# Patient Record
Sex: Female | Born: 1985 | Race: Black or African American | Hispanic: No | Marital: Single | Smoking: Never smoker
Health system: Southern US, Community
[De-identification: ages and names within clinical notes are randomized; demographics above are authoritative.]

## PROBLEM LIST (undated history)

## (undated) DIAGNOSIS — I10 Essential (primary) hypertension: Secondary | ICD-10-CM

## (undated) DIAGNOSIS — G4700 Insomnia, unspecified: Secondary | ICD-10-CM

## (undated) DIAGNOSIS — G43909 Migraine, unspecified, not intractable, without status migrainosus: Secondary | ICD-10-CM

## (undated) HISTORY — DX: Essential (primary) hypertension: I10

## (undated) HISTORY — PX: NO PAST SURGERIES: SHX2092

## (undated) HISTORY — DX: Insomnia, unspecified: G47.00

---
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
## 2007-09-08 ENCOUNTER — Emergency Department (HOSPITAL_COMMUNITY): Admission: EM | Admit: 2007-09-08 | Discharge: 2007-09-08 | Payer: Self-pay | Admitting: Family Medicine

## 2007-10-27 ENCOUNTER — Emergency Department (HOSPITAL_COMMUNITY): Admission: EM | Admit: 2007-10-27 | Discharge: 2007-10-27 | Payer: Self-pay | Admitting: Family Medicine

## 2008-11-13 ENCOUNTER — Emergency Department (HOSPITAL_COMMUNITY): Admission: EM | Admit: 2008-11-13 | Discharge: 2008-11-13 | Payer: Self-pay | Admitting: Emergency Medicine

## 2009-04-04 ENCOUNTER — Emergency Department (HOSPITAL_COMMUNITY): Admission: EM | Admit: 2009-04-04 | Discharge: 2009-04-04 | Payer: Self-pay | Admitting: Family Medicine

## 2009-06-30 ENCOUNTER — Emergency Department (HOSPITAL_COMMUNITY): Admission: EM | Admit: 2009-06-30 | Discharge: 2009-06-30 | Payer: Self-pay | Admitting: Family Medicine
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
## 2011-02-01 LAB — POCT URINALYSIS DIP (DEVICE)
Bilirubin Urine: NEGATIVE
Glucose, UA: NEGATIVE
Ketones, ur: NEGATIVE
Nitrite: POSITIVE — AB
Protein, ur: 100 — AB
Specific Gravity, Urine: 1.02

## 2011-02-01 LAB — POCT PREGNANCY, URINE
Operator id: 239701
Preg Test, Ur: NEGATIVE

## 2011-02-01 LAB — URINE CULTURE

## 2012-11-13 ENCOUNTER — Emergency Department (HOSPITAL_COMMUNITY)
Admission: EM | Admit: 2012-11-13 | Discharge: 2012-11-13 | Disposition: A | Discharge status: Home / Self Care | Payer: BC Managed Care – PPO | Source: Home / Self Care | Attending: Family Medicine | Admitting: Family Medicine

## 2012-11-13 ENCOUNTER — Encounter (HOSPITAL_COMMUNITY): Payer: Self-pay | Admitting: Emergency Medicine

## 2012-11-13 DIAGNOSIS — B9789 Other viral agents as the cause of diseases classified elsewhere: Secondary | ICD-10-CM

## 2012-11-13 DIAGNOSIS — J329 Chronic sinusitis, unspecified: Secondary | ICD-10-CM

## 2012-11-13 DIAGNOSIS — G43909 Migraine, unspecified, not intractable, without status migrainosus: Secondary | ICD-10-CM

## 2012-11-13 HISTORY — DX: Migraine, unspecified, not intractable, without status migrainosus: G43.909

## 2012-11-13 MED ORDER — FLUTICASONE PROPIONATE 50 MCG/ACT NA SUSP: 2.0000 | Freq: Every day | NASAL | Status: DC

## 2012-11-13 MED ORDER — DEXAMETHASONE SODIUM PHOSPHATE 10 MG/ML IJ SOLN
10.0000 mg | Freq: Once | INTRAMUSCULAR | Status: AC
  Administered 2012-11-13: 10 mg via INTRAVENOUS

## 2012-11-13 MED ORDER — DEXAMETHASONE SODIUM PHOSPHATE 10 MG/ML IJ SOLN
INTRAMUSCULAR | Status: AC
  Filled 2012-11-13: qty 1

## 2012-11-13 MED ORDER — HYDROCODONE-ACETAMINOPHEN 5-325 MG PO TABS: 1.0000 | ORAL_TABLET | Freq: Every evening | ORAL | Status: DC | PRN

## 2012-11-13 MED ORDER — KETOROLAC TROMETHAMINE 60 MG/2ML IM SOLN
INTRAMUSCULAR | Status: AC
  Filled 2012-11-13: qty 2

## 2012-11-13 MED ORDER — KETOROLAC TROMETHAMINE 60 MG/2ML IM SOLN
60.0000 mg | Freq: Once | INTRAMUSCULAR | Status: AC
  Administered 2012-11-13: 60 mg via INTRAMUSCULAR

## 2012-11-13 NOTE — ED Provider Notes (Signed)
   History    CSN: [id number] Arrival date & time 11/13/12  1719  First MD Initiated Contact with Patient 11/13/12 1759     Chief Complaint  Patient presents with  . Facial Pain    sinus pressure and pain. runny nose. headache (hx of migraines) bilateral ear pain. generlized body aches.     HPI Facial pain: started 2 weeks ago. Associated w/ runny nose, bilateral ear pain. Generalized body aches and fatigue, and HA. Pt typically takes Vicodin and percocet for migraines QHS daily, but PCP no longer will take her insurance so she is out of these medications. Takes these daily at night. Has not taken for 2 days. Denies sick contacts, fever, nausea, vomiting, rash, diarrhea, constipation. Tylenol sinus w/ some relief of sinus pressure.   Past Medical History  Diagnosis Date  . Migraines    History reviewed. No pertinent past surgical history. History reviewed. No pertinent family history. History  Substance Use Topics  . Smoking status: Never Smoker   . Smokeless tobacco: Not on file  . Alcohol Use: No   OB History   Grav Para Term Preterm Abortions TAB SAB Ect Mult Living                 Review of Systems Per HPI w/ all other systems negative Allergies  Review of patient's allergies indicates no known allergies.  Home Medications  No current outpatient prescriptions on file. BP 137/94  Pulse 81  Temp(Src) 98.4 F (36.9 C) (Oral)  Resp 24  SpO2 100%  LMP 11/10/2012 Physical Exam Gen: NAD, WNWD HEENT: mild maxillary and frontal sinus pressure on palpation, nasal turbinates boggy, oropharynx clear, no cervical lymphadenopathy, TM nml bilat. CV: RRR, no m/r/g Res: CTAB, nml effort Ext: no edema, warm well perfused Skin: intact, no rash Nero, CN 2-12 intact, proprioception intact, cerebellar function nml    ED Course  Procedures (including critical care time) Labs Reviewed - No data to display No results found. No diagnosis found.  MDM  27yo AAF w/ migraine and  viral URI.  - toradol  - decadron (as migraine for <48 hrs may benefit)  - Will discharge w/ Rx for 7 days of vicodin 5/325. Pt counseled to find new PCP as this is not typical mgt for migraines. However given long term use there is concern for withdrawal and that migraines are now in part due to prolonged opioid use.  - Flonase for sinus congestion    Ozella Rocks, MD 11/13/12 [phone]

## 2012-11-13 NOTE — ED Notes (Signed)
Pt c/o sinus pressure and pain. Runny nose. Bilateral ear pain and headache. (hx of migraines) Pt denies fever, n/v/d. Symptoms present x 1 wk. Pt has used otc meds with no relief in symptoms.

## 2012-11-14 NOTE — ED Provider Notes (Signed)
Medical screening examination/treatment/procedure(s) were performed by resident physician or non-physician practitioner and as supervising physician I was immediately available for consultation/collaboration.   Lyndsay Talamante DOUGLAS MD.   Edgar Corrigan D Aliya Sol, MD 11/14/12 1640 

## 2013-02-19 ENCOUNTER — Encounter (HOSPITAL_COMMUNITY): Payer: Self-pay | Admitting: Emergency Medicine

## 2013-02-19 ENCOUNTER — Emergency Department (HOSPITAL_COMMUNITY)
Admission: EM | Admit: 2013-02-19 | Discharge: 2013-02-19 | Disposition: A | Discharge status: Home / Self Care | Payer: BC Managed Care – PPO | Attending: Emergency Medicine | Admitting: Emergency Medicine

## 2013-02-19 DIAGNOSIS — J329 Chronic sinusitis, unspecified: Secondary | ICD-10-CM | POA: Insufficient documentation

## 2013-02-19 DIAGNOSIS — Z792 Long term (current) use of antibiotics: Secondary | ICD-10-CM | POA: Insufficient documentation

## 2013-02-19 DIAGNOSIS — Z8679 Personal history of other diseases of the circulatory system: Secondary | ICD-10-CM | POA: Insufficient documentation

## 2013-02-19 MED ORDER — HYDROCODONE-ACETAMINOPHEN 5-325 MG PO TABS: 1.0000 | ORAL_TABLET | Freq: Four times a day (QID) | ORAL | Status: DC | PRN

## 2013-02-19 MED ORDER — AMOXICILLIN 500 MG PO CAPS: 500.0000 mg | ORAL_CAPSULE | Freq: Three times a day (TID) | ORAL | Status: DC

## 2013-02-19 MED ORDER — FLUCONAZOLE 200 MG PO TABS: ORAL_TABLET | ORAL | Status: DC

## 2013-02-19 NOTE — ED Notes (Signed)
Pt states headache, cough, sinus congestion x 2 weeks.  No fever.  Some days sore throat.  No diarrhea.

## 2013-02-19 NOTE — ED Provider Notes (Signed)
CSN: [id number]     Arrival date & time 02/19/13  2110 History   First MD Initiated Contact with Patient 02/19/13 2139     Chief Complaint  Patient presents with  . Headache  . Nasal Congestion   (Consider location/radiation/quality/duration/timing/severity/associated sxs/prior Treatment) Patient is a 27 y.o. female presenting with cough. The history is provided by the patient (the pt complains of a cough and sinus congestion). No language interpreter was used.  Cough Cough characteristics:  Non-productive Severity:  Moderate Onset quality:  Gradual Timing:  Constant Progression:  Waxing and waning Chronicity:  New Smoker: no   Context: not animal exposure   Associated symptoms: no chest pain, no eye discharge, no headaches and no rash     Past Medical History  Diagnosis Date  . Migraines    History reviewed. No pertinent past surgical history. History reviewed. No pertinent family history. History  Substance Use Topics  . Smoking status: Never Smoker   . Smokeless tobacco: Not on file  . Alcohol Use: No   OB History   Grav Para Term Preterm Abortions TAB SAB Ect Mult Living                 Review of Systems  Constitutional: Negative for appetite change and fatigue.  HENT: Positive for sinus pressure. Negative for congestion and ear discharge.   Eyes: Negative for discharge.  Respiratory: Positive for cough.   Cardiovascular: Negative for chest pain.  Gastrointestinal: Negative for abdominal pain and diarrhea.  Genitourinary: Negative for frequency and hematuria.  Musculoskeletal: Negative for back pain.  Skin: Negative for rash.  Neurological: Negative for seizures and headaches.  Psychiatric/Behavioral: Negative for hallucinations.    Allergies  Review of patient's allergies indicates no known allergies.  Home Medications   Current Outpatient Rx  Name  Route  Sig  Dispense  Refill  . HYDROcodone-acetaminophen (NORCO/VICODIN) 5-325 MG per tablet   Oral   Take 1 tablet by mouth at bedtime as needed for pain.   14 tablet   0   . amoxicillin (AMOXIL) 500 MG capsule   Oral   Take 1 capsule (500 mg total) by mouth 3 (three) times daily.   21 capsule   0   . fluconazole (DIFLUCAN) 200 MG tablet      Take for yeast infection   1 tablet   0    BP 145/103  Pulse 77  Temp(Src) 98.4 F (36.9 C) (Oral)  Resp 18  SpO2 100%  LMP 02/15/2013 Physical Exam  Constitutional: She is oriented to person, place, and time. She appears well-developed.  HENT:  Head: Normocephalic.  Tender frontal sinuses  Eyes: Conjunctivae and EOM are normal. No scleral icterus.  Neck: Neck supple. No thyromegaly present.  Cardiovascular: Normal rate and regular rhythm.  Exam reveals no gallop and no friction rub.   No murmur heard. Pulmonary/Chest: No stridor. She has no wheezes. She has no rales. She exhibits no tenderness.  Abdominal: She exhibits no distension. There is no tenderness. There is no rebound.  Musculoskeletal: Normal range of motion. She exhibits no edema.  Lymphadenopathy:    She has no cervical adenopathy.  Neurological: She is oriented to person, place, and time. She exhibits normal muscle tone. Coordination normal.  Skin: No rash noted. No erythema.  Psychiatric: She has a normal mood and affect. Her behavior is normal.    ED Course  Procedures (including critical care time) Labs Review Labs Reviewed - No data to display Imaging Review  No results found.  EKG Interpretation   None       MDM   1. Sinusitis       Benny Lennert, MD 02/19/13 2158

## 2013-07-03 ENCOUNTER — Institutional Professional Consult (permissible substitution): Payer: BC Managed Care – PPO | Admitting: Pulmonary Disease

## 2013-07-24 ENCOUNTER — Institutional Professional Consult (permissible substitution): Payer: BC Managed Care – PPO | Admitting: Pulmonary Disease

## 2013-08-20 ENCOUNTER — Ambulatory Visit (INDEPENDENT_AMBULATORY_CARE_PROVIDER_SITE_OTHER): Payer: BC Managed Care – PPO | Admitting: Pulmonary Disease

## 2013-08-20 ENCOUNTER — Encounter: Payer: Self-pay | Admitting: Pulmonary Disease

## 2013-08-20 VITALS — BP 120/86 | HR 70 | Temp 98.5°F | Ht 66.0 in | Wt 228.2 lb

## 2013-08-20 DIAGNOSIS — G4700 Insomnia, unspecified: Secondary | ICD-10-CM | POA: Insufficient documentation

## 2013-08-20 NOTE — Progress Notes (Signed)
Subjective:    Patient ID: Alexis CorningShamsa S Meanor, female    DOB: 10/29/1985, 28 y.o.   MRN: [id number]  HPI The patient is a 28 year old female who I've been asked to see for difficulties initiating and maintaining sleep. This has been an ongoing problem for her for many years, but has been worse over the last one to 2 years. She relates difficulty getting to sleep, and will awaken 10-15 times a night for unknown reasons. She often has difficulties getting back to sleep when she awakens. She is not rested in the mornings upon arising. She has been told that she snores if she is tired, but no one has mentioned witnessed apneas during the night. She does not think that she kicks, but does have some discomfort in the evenings in her lower extremities that are made better with movement. She describes some sleep pressure with inactivity during the day, but it is not significant. She does not nap during the day. She does not fall asleep easily in the evenings, and has no sleepiness with driving. She is unsure if her headaches contribute to her sleep issues. The patient states that her weight is up 40 pounds over the last 2 years, and her Epworth score today is 7.   Sleep Questionnaire What time do you typically go to bed?( Between what hours) varies when not in school-- 10pm (when in school) varies when not in school-- 10pm (when in school) at 1128 on 08/20/13 by Maisie FusAshtyn M Green, CMA How long does it take you to fall asleep? several hours several hours at 1128 on 08/20/13 by Maisie FusAshtyn M Green, CMA How many times during the night do you wake up? No Value 10-15x at 1128 on 08/20/13 by Maisie FusAshtyn M Green, CMA What time do you get out of bed to start your day? 0500 0500 at 1128 on 08/20/13 by Maisie FusAshtyn M Green, CMA Do you drive or operate heavy machinery in your occupation? No No at 1128 on 08/20/13 by Maisie FusAshtyn M Green, CMA How much has your weight changed (up or down) over the past two years? (In pounds) 40 lb (18.144  kg) 40 lb (18.144 kg) at 1128 on 08/20/13 by Maisie FusAshtyn M Green, CMA Have you ever had a sleep study before? No No at 1128 on 08/20/13 by Maisie FusAshtyn M Green, CMA Do you currently use CPAP? No No at 1128 on 08/20/13 by Maisie FusAshtyn M Green, CMA Do you wear oxygen at any time? No No at 1128 on 08/20/13 by Maisie FusAshtyn M Green, CMA O2 Flow Rate (L/min)    Review of Systems  Constitutional: Positive for unexpected weight change. Negative for fever.  HENT: Positive for congestion, dental problem, ear pain and sneezing. Negative for nosebleeds, postnasal drip, rhinorrhea, sinus pressure, sore throat and trouble swallowing.        Nose itching/dry nose  Eyes: Positive for itching. Negative for redness.  Respiratory: Negative for cough, chest tightness, shortness of breath and wheezing.   Cardiovascular: Negative for palpitations and leg swelling.  Gastrointestinal: Negative for nausea and vomiting.  Genitourinary: Negative for dysuria.  Musculoskeletal: Negative for joint swelling.  Skin: Negative for rash ( itching).  Neurological: Positive for headaches.  Hematological: Does not bruise/bleed easily.  Psychiatric/Behavioral: Positive for dysphoric mood. The patient is nervous/anxious.        Objective:   Physical Exam Constitutional:  Obese female, no acute distress  HENT:  Nares patent without discharge, piercing thru septum  Oropharynx without exudate, palate and uvula are mildly  elongated.   Eyes:  Perrla, eomi, no scleral icterus  Neck:  No JVD, no TMG  Cardiovascular:  Normal rate, regular rhythm, no rubs or gallops.  No murmurs        Intact distal pulses  Pulmonary :  Normal breath sounds, no stridor or respiratory distress   No rales, rhonchi, or wheezing  Abdominal:  Soft, nondistended, bowel sounds present.  No tenderness noted.   Musculoskeletal:  No lower extremity edema noted.  Lymph Nodes:  No cervical lymphadenopathy noted  Skin:  No cyanosis noted  Neurologic: Mildly  sleepy but appropriate, moves all 4 extremities without obvious deficit.         Assessment & Plan:

## 2013-08-20 NOTE — Patient Instructions (Signed)
Work on the behavioral therapies we discussed. Will send a note to your referring physician for them to consider behavioral therapy. Will schedule for a sleep study to evaluate for sleep apnea or other sleep disorders.   Please leave a number where you can be reached so we can schedule for you.

## 2013-08-20 NOTE — Assessment & Plan Note (Signed)
The patient has had issues initiating and maintaining sleep for many many years, but it has been worse over the last one to 2 years. Some of this is related to very poor sleep hygiene, and I have reviewed the various techniques with her to improve this. She will obviously need cognitive behavioral therapy in order to improve her sleep, and I will send a note back to her referring physician to consider a referral to a behavioral therapist. The question that I have is whether she may have a sleep disorder that is contributing to this, such as sleep apnea given her 40 pound weight gain over the last 2 years. She also has some symptoms suggestive of a movement disorder of sleep. Finally, it is unclear how much her headaches may be contributing to her sleep disruption. I will schedule her for a sleep study for further evaluation, but if it is unremarkable, and I think behavioral therapy will be her best treatment.

## 2013-10-04 ENCOUNTER — Ambulatory Visit (HOSPITAL_BASED_OUTPATIENT_CLINIC_OR_DEPARTMENT_OTHER): Payer: Self-pay | Attending: Pulmonary Disease

## 2015-05-08 ENCOUNTER — Encounter (HOSPITAL_COMMUNITY): Payer: Self-pay | Admitting: Family Medicine

## 2015-05-08 ENCOUNTER — Emergency Department (HOSPITAL_COMMUNITY)
Admission: EM | Admit: 2015-05-08 | Discharge: 2015-05-09 | Disposition: A | Discharge status: Home / Self Care | Payer: Self-pay | Attending: Emergency Medicine | Admitting: Emergency Medicine

## 2015-05-08 DIAGNOSIS — Z79899 Other long term (current) drug therapy: Secondary | ICD-10-CM | POA: Insufficient documentation

## 2015-05-08 DIAGNOSIS — I10 Essential (primary) hypertension: Secondary | ICD-10-CM | POA: Insufficient documentation

## 2015-05-08 DIAGNOSIS — N644 Mastodynia: Secondary | ICD-10-CM | POA: Insufficient documentation

## 2015-05-08 DIAGNOSIS — Z791 Long term (current) use of non-steroidal anti-inflammatories (NSAID): Secondary | ICD-10-CM | POA: Insufficient documentation

## 2015-05-08 DIAGNOSIS — G43909 Migraine, unspecified, not intractable, without status migrainosus: Secondary | ICD-10-CM | POA: Insufficient documentation

## 2015-05-08 NOTE — ED Notes (Signed)
Patient reports she noticed a lump on right breast about 4 weeks ago. Tonight, she noticed the lump got bigger and started having pain.

## 2015-05-08 NOTE — ED Notes (Signed)
Bed: WA26 Expected date:  Expected time:  Means of arrival:  Comments: 

## 2015-05-08 NOTE — ED Provider Notes (Signed)
CSN: [id number]     Arrival date & time 05/08/15  2329 History  By signing my name below, I, Phillis Haggis, attest that this documentation has been prepared under the direction and in the presence of [employer], PA-C. Electronically Signed: Phillis Haggis, ED Scribe. 05/08/2015. 12:09 AM.  Chief Complaint  Patient presents with  . Breast Mass   The history is provided by the patient. No language interpreter was used.   HPI Comments: Alexis Ramsey is a 30 y.o. Female with a hx of HTN who presents to the Emergency Department complaining of a painful lump to the right breast. She states she first noticed the lump about 1 month ago, but it became larger and more painful a few hours ago. She has taken Tylenol for pain to no relief. She states that she is not breast feeding. Pt states she recently began drinking more caffeine. She denies fever.  Pt does not have an OB-GYN.   Past Medical History  Diagnosis Date  . Migraines   . HTN (hypertension)   . Insomnia    Past Surgical History  Procedure Laterality Date  . No past surgeries     Family History  Problem Relation Age of Onset  . Diabetes Mellitus I Mother   . Headache Mother   . Hypertension Mother   . Diabetes Mellitus I Maternal Grandmother   . Hypertension Father   . Hypertension Other     grandparents on both sides   Social History  Substance Use Topics  . Smoking status: Never Smoker   . Smokeless tobacco: None  . Alcohol Use: No   OB History    No data available      Review of Systems  Constitutional: Negative for fever.  Skin:       Mass to right breast  All other systems reviewed and are negative.   Allergies  Review of patient's allergies indicates no known allergies.  Home Medications   Prior to Admission medications   Medication Sig Start Date End Date Taking? Authorizing Provider  ACZONE 5 % topical gel Use as directed for Acne caused by pred injections given for HA 06/22/13   Historical Provider, MD   fluconazole (DIFLUCAN) 200 MG tablet Take for yeast infection 02/19/13   Bethann Berkshire, MD  hydrochlorothiazide (HYDRODIURIL) 12.5 MG tablet Take 1 tablet by mouth daily. 07/30/13   Historical Provider, MD  meloxicam (MOBIC) 7.5 MG tablet Take 2 tablets (15 mg total) by mouth daily. 05/09/15   Antony Madura, PA-C  oxyCODONE-acetaminophen (PERCOCET) 10-325 MG per tablet Take 1 tablet by mouth as needed. 07/30/13   Historical Provider, MD  zonisamide (ZONEGRAN) 25 MG capsule Take 1 capsule by mouth at bedtime. Will increase dose over 4 weeks to 4 caps. 05/22/13   Historical Provider, MD   BP 141/94 mmHg  Pulse 76  Temp(Src) 98.9 F (37.2 C) (Oral)  Resp 20  Ht 5\' 6"  (1.676 m)  Wt 86.183 kg  BMI 30.68 kg/m2  SpO2 99%  LMP 05/04/2015   Physical Exam  Constitutional: She is oriented to person, place, and time. She appears well-developed and well-nourished. No distress.  Nontoxic/nonseptic appearing  HENT:  Head: Normocephalic and atraumatic.  Eyes: Conjunctivae and EOM are normal. No scleral icterus.  Neck: Normal range of motion.  Cardiovascular: Normal rate, regular rhythm and intact distal pulses.   Pulmonary/Chest: Effort normal. No respiratory distress. She has no wheezes. She has no rales. Right breast exhibits tenderness. Right breast exhibits no  inverted nipple, no nipple discharge and no skin change.    Tender, mobile nodule of 1cm diameter to the R breast. No erythema or heat to touch. No fluctuance. No nipple inversion or skin stippling. No nipple discharge.  Musculoskeletal: Normal range of motion.  Neurological: She is alert and oriented to person, place, and time.  Skin: Skin is warm and dry. No rash noted. She is not diaphoretic. No erythema. No pallor.  Psychiatric: She has a normal mood and affect. Her behavior is normal.  Nursing note and vitals reviewed.   ED Course  Procedures (including critical care time) DIAGNOSTIC STUDIES: Oxygen Saturation is 99% on RA, normal by  my interpretation.    COORDINATION OF CARE: 12:07 AM-Discussed treatment plan which includes warm compresses with pt at bedside and pt agreed to plan.    Labs Review Labs Reviewed - No data to display  Imaging Review No results found. I have personally reviewed and evaluated these images and lab results as part of my medical decision-making.   EKG Interpretation None      MDM   Final diagnoses:  Breast pain, right    30 year old female presents to the emergency department for evaluation of right breast pain. She has a tender, mobile nodule noted to her medial right breast at approximately the 3 o'clock position. No erythema or surrounding induration. No heat to touch or fluctuance. No concern for mastoiditis or abscess. No nipple inversion or discharge. Patient is afebrile. Suspect fibrocystic changes versus lymphadenopathy. [facility] to the [facility] for [facility]up. Have recommended NSAIDs and warm compresses. Return precautions given at discharge. Patient discharged in good condition with no unaddressed concerns.  I personally performed the services described in this documentation, which was scribed in my presence. The recorded information has been reviewed and is accurate.    Filed Vitals:   05/08/15 2343  BP: 141/94  Pulse: 76  Temp: 98.9 F (37.2 C)  TempSrc: Oral  Resp: 20  Height: 5\' 6"  (1.676 m)  Weight: 86.183 kg  SpO2: 99%      Antony MaduraKelly Jamille Fisher, PA-C 05/09/15 0017  April Palumbo, MD 05/09/15 0104

## 2015-05-09 MED ORDER — MELOXICAM 15 MG PO TABS
15.0000 mg | ORAL_TABLET | Freq: Every day | ORAL | Status: DC
  Administered 2015-05-09: 15 mg via ORAL
  Filled 2015-05-09: qty 1

## 2015-05-09 MED ORDER — MELOXICAM 7.5 MG PO TABS: 15.0000 mg | ORAL_TABLET | Freq: Every day | ORAL | Status: DC

## 2015-05-09 NOTE — Discharge Instructions (Signed)
Follow-up with the [facility]. Apply warm compresses to the area 3-4 times per day. Take Mobic as prescribed for pain. Follow-up with your primary care doctor as needed.  Fibrocystic Breast Changes Fibrocystic breast changes occur when breast ducts become blocked, causing painful, fluid-filled lumps (cysts) to form in the breast. This is a common condition that is noncancerous (benign). It occurs when women go through hormonal changes during their menstrual cycle. Fibrocystic breast changes can affect one or both breasts. CAUSES  The exact cause of fibrocystic breast changes is not known, but it may be related to the female hormones estrogen and progesterone. Family traits that get passed from parent to child (genetics) may also be a factor in some cases. SIGNS AND SYMPTOMS   Tenderness, mild discomfort, or pain.   Swelling.   Rope-like feeling when touching the breast.   Lumpy breast, one or both sides.   Changes in breast size, especially before (larger) and after (smaller) the menstrual period.   Green or dark brown nipple discharge (not blood).  Symptoms are usually worse before menstrual periods start and get better toward the end of the menstrual period.  DIAGNOSIS  To make a diagnosis, your health care provider will ask you questions and perform a physical exam of your breasts. The health care provider may recommend other tests that can examine inside your breasts, such as:  A breast X-ray (mammogram).   Ultrasonography.  An MRI.  If something more than fibrocystic breast changes is suspected, your health care provider may take a breast tissue sample (breast biopsy) to examine. TREATMENT  Often, treatment is not needed. Your health care provider may recommend over-the-counter pain relievers to help lessen pain or discomfort caused by the fibrocystic breast changes. You may also be asked to change your diet to limit or stop eating foods or drinking beverages that  contain caffeine. Foods and beverages that contain caffeine include chocolate, soda, coffee, and tea. Reducing sugar and fat in your diet may also help. Your health care provider may also recommend:  Fine needle aspiration to remove fluid from a cyst that is causing pain.   Surgery to remove a large, persistent, and tender cyst. HOME CARE INSTRUCTIONS   Examine your breasts after every menstrual period. If you do not have menstrual periods, check your breasts the first day of every month. Feel for changes, such as more tenderness, a new growth, a change in breast size, or a change in a lump that has always been there.   Only take over-the-counter or prescription medicine as directed by your health care provider.   Wear a well-fitted support or sports bra, especially when exercising.   Decrease or avoid caffeine, fat, and sugar in your diet as directed by your health care provider.  SEEK MEDICAL CARE IF:   You have fluid leaking (discharge) from your nipples, especially bloody discharge.   You have new lumps or bumps in the breast.   Your breast or breasts become enlarged, red, and painful.   You have areas of your breast that pucker in.   Your nipples appear flat or indented.    This information is not intended to replace advice given to you by your health care provider. Make sure you discuss any questions you have with your health care provider.   Document Released: 02/07/2006 Document Revised: 01/12/2015 Document Reviewed: 10/12/2012 Elsevier Interactive Patient Education [employer]2016 [employer].

## 2016-02-10 ENCOUNTER — Ambulatory Visit (HOSPITAL_COMMUNITY)
Admission: EM | Admit: 2016-02-10 | Discharge: 2016-02-10 | Disposition: A | Discharge status: Home / Self Care | Payer: Self-pay | Attending: Family Medicine | Admitting: Family Medicine

## 2016-02-10 ENCOUNTER — Encounter (HOSPITAL_COMMUNITY): Payer: Self-pay | Admitting: Family Medicine

## 2016-02-10 DIAGNOSIS — R0789 Other chest pain: Secondary | ICD-10-CM

## 2016-02-10 DIAGNOSIS — I10 Essential (primary) hypertension: Secondary | ICD-10-CM

## 2016-02-10 DIAGNOSIS — R071 Chest pain on breathing: Secondary | ICD-10-CM

## 2016-02-10 DIAGNOSIS — R1013 Epigastric pain: Secondary | ICD-10-CM

## 2016-02-10 MED ORDER — NAPROXEN 500 MG PO TABS: 500.0000 mg | ORAL_TABLET | Freq: Two times a day (BID) | ORAL | 0 refills | Status: DC | PRN

## 2016-02-10 MED ORDER — HYDROCHLOROTHIAZIDE 12.5 MG PO TABS: 12.5000 mg | ORAL_TABLET | Freq: Every day | ORAL | 0 refills | Status: DC

## 2016-02-10 NOTE — Discharge Instructions (Signed)
Start Naproxen twice a day as needed for pain. Also encouraged to try Prilosec or Nexium twice a day. Follow-up with your primary care provider for blood pressure management and/or go to ER if symptoms and pain worsen.

## 2016-02-10 NOTE — ED Triage Notes (Signed)
Pt here for generalized chest pain and upper back pain. Wise with movement and breathing. sts also some epigastric pain that is worse after eating. Painful with palpation. sts aleve has been helping some.

## 2016-02-10 NOTE — ED Provider Notes (Signed)
CSN: [id number]     Arrival date & time 02/10/16  1923 History   First MD Initiated Contact with Patient 02/10/16 2012     Chief Complaint  Patient presents with  . Chest Pain   (Consider location/radiation/quality/duration/timing/severity/associated sxs/prior Treatment) 30 year old female presents with epigastric pain and vomiting about 5 to 6 days ago. No longer nauseous and vomiting. Now having more central chest discomfort that spreads to neck and upper back with any muscle movement. Also having tenderness when pressing on mid-sternal area of chest. No fever, shortness of breath or diarrhea. Has been taking Ibuprofen and Aleve with minimal relief, although symptoms are improving. In addition, concerned over fluctuation in blood pressure. Was on HCTZ but ran out a month ago. Found 2 pills which she took today and yesterday so blood pressure is better today. Requests refill until she can re-establish with a PCP.    The history is provided by the patient.    Past Medical History:  Diagnosis Date  . HTN (hypertension)   . Insomnia   . Migraines    Past Surgical History:  Procedure Laterality Date  . NO PAST SURGERIES     Family History  Problem Relation Age of Onset  . Diabetes Mellitus I Mother   . Headache Mother   . Hypertension Mother   . Diabetes Mellitus I Maternal Grandmother   . Hypertension Father   . Hypertension Other     grandparents on both sides   Social History  Substance Use Topics  . Smoking status: Never Smoker  . Smokeless tobacco: Never Used  . Alcohol use No   OB History    No data available     Review of Systems  Constitutional: Positive for appetite change. Negative for chills, diaphoresis, fatigue and fever.  HENT: Negative for congestion.   Eyes: Negative for visual disturbance.  Respiratory: Negative for cough, chest tightness, shortness of breath and wheezing.   Cardiovascular: Positive for chest pain. Negative for palpitations and leg  swelling.  Gastrointestinal: Positive for abdominal pain, nausea and vomiting. Negative for blood in stool, constipation and diarrhea.  Genitourinary: Negative for difficulty urinating.  Musculoskeletal: Positive for neck pain. Negative for back pain, myalgias and neck stiffness.  Skin: Negative for rash.  Neurological: Negative for dizziness, syncope, weakness, numbness and headaches.    Allergies  Review of patient's allergies indicates no known allergies.  Home Medications   Prior to Admission medications   Medication Sig Start Date End Date Taking? Authorizing Provider  hydrochlorothiazide (HYDRODIURIL) 12.5 MG tablet Take 1 tablet (12.5 mg total) by mouth daily. 02/10/16 03/11/16  Sudie GrumblingAnn Berry Graeme Menees, NP  naproxen (NAPROSYN) 500 MG tablet Take 1 tablet (500 mg total) by mouth 2 (two) times daily as needed for moderate pain. 02/10/16   Sudie GrumblingAnn Berry Gideon Burstein, NP  oxyCODONE-acetaminophen (PERCOCET) 10-325 MG per tablet Take 1 tablet by mouth as needed. 07/30/13   Historical Provider, MD  zonisamide (ZONEGRAN) 25 MG capsule Take 1 capsule by mouth at bedtime. Will increase dose over 4 weeks to 4 caps. 05/22/13   Historical Provider, MD   Meds Ordered and Administered this Visit  Medications - No data to display  BP 122/96   Pulse 82   Temp 98.2 F (36.8 C) (Oral)   Resp 18   SpO2 100%  No data found.   Physical Exam  Constitutional: She is oriented to person, place, and time. She appears well-developed and well-nourished. No distress.  HENT:  Head: Normocephalic and  atraumatic.  Nose: Nose normal.  Mouth/Throat: Uvula is midline, oropharynx is clear and moist and mucous membranes are normal.  Eyes: EOM are normal. Pupils are equal, round, and reactive to light.  Neck: Normal range of motion. Neck supple. Muscular tenderness present. No neck rigidity. Normal range of motion present.    Tender along the Paraspinous and Trapezius muscles bilaterally. Has full range of motion but with pain.     Cardiovascular: Normal rate, regular rhythm, normal heart sounds, intact distal pulses and normal pulses.   No murmur heard. Pulmonary/Chest: Effort normal and breath sounds normal. No respiratory distress. She exhibits tenderness. She exhibits no mass, no edema, no deformity, no swelling and no retraction.    Tender central sternal area and epigastric abdominal area.   Abdominal: Soft. Normal appearance and bowel sounds are normal. She exhibits no distension and no mass. There is no hepatosplenomegaly. There is tenderness in the epigastric area. There is no rigidity, no rebound, no guarding and no CVA tenderness.  Musculoskeletal: Normal range of motion.  Lymphadenopathy:    She has no cervical adenopathy.  Neurological: She is alert and oriented to person, place, and time. She has normal strength. No sensory deficit.  Skin: Skin is warm and dry. Capillary refill takes less than 2 seconds.  Psychiatric: She has a normal mood and affect. Her behavior is normal. Judgment and thought content normal.    Urgent Care Course   Clinical Course    Procedures (including critical care time)  Labs Review Labs Reviewed - No data to display  Imaging Review No results found.   Visual Acuity Review  Right Eye Distance:   Left Eye Distance:   Bilateral Distance:    Right Eye Near:   Left Eye Near:    Bilateral Near:         MDM   1. Costochondral chest pain   2. Essential hypertension   3. Epigastric abdominal pain    Discussed that the epigastric pain and possible pain in the sternal area could be possible GERD. Recommend OTC Prilosec 20mg  twice a day for 2 weeks then once daily. Also discussed probable costochondral pain- recommend take Naproxen twice a day as needed for pain. Continue HCTZ 12.5mg  once daily #30 no refill Rx provided. Discussed that chest pain was most likely more chest wall/muscular pain and not cardiac in origin but if pain persists, any difficulty breathing,  nausea or vomiting returns, she should go to ER ASAP. Otherwise, follow-up with a primary care provider for blood pressure management within the next month.       Sudie Grumbling, NP 02/11/16 [phone]

## 2018-03-14 ENCOUNTER — Encounter: Payer: Self-pay | Admitting: Internal Medicine

## 2018-03-14 ENCOUNTER — Ambulatory Visit: Payer: Self-pay | Attending: Internal Medicine | Admitting: Internal Medicine

## 2018-03-14 VITALS — BP 156/102 | HR 82 | Temp 98.5°F | Resp 16 | Ht 67.0 in | Wt 234.0 lb

## 2018-03-14 DIAGNOSIS — F329 Major depressive disorder, single episode, unspecified: Secondary | ICD-10-CM | POA: Insufficient documentation

## 2018-03-14 DIAGNOSIS — F32A Depression, unspecified: Secondary | ICD-10-CM | POA: Insufficient documentation

## 2018-03-14 DIAGNOSIS — N6001 Solitary cyst of right breast: Secondary | ICD-10-CM | POA: Insufficient documentation

## 2018-03-14 DIAGNOSIS — Z6836 Body mass index (BMI) 36.0-36.9, adult: Secondary | ICD-10-CM | POA: Insufficient documentation

## 2018-03-14 DIAGNOSIS — Z833 Family history of diabetes mellitus: Secondary | ICD-10-CM | POA: Insufficient documentation

## 2018-03-14 DIAGNOSIS — Z111 Encounter for screening for respiratory tuberculosis: Secondary | ICD-10-CM | POA: Insufficient documentation

## 2018-03-14 DIAGNOSIS — Z23 Encounter for immunization: Secondary | ICD-10-CM | POA: Insufficient documentation

## 2018-03-14 DIAGNOSIS — E669 Obesity, unspecified: Secondary | ICD-10-CM | POA: Insufficient documentation

## 2018-03-14 DIAGNOSIS — R1030 Lower abdominal pain, unspecified: Secondary | ICD-10-CM | POA: Insufficient documentation

## 2018-03-14 DIAGNOSIS — M546 Pain in thoracic spine: Secondary | ICD-10-CM | POA: Insufficient documentation

## 2018-03-14 DIAGNOSIS — Z8249 Family history of ischemic heart disease and other diseases of the circulatory system: Secondary | ICD-10-CM | POA: Insufficient documentation

## 2018-03-14 DIAGNOSIS — Z6835 Body mass index (BMI) 35.0-35.9, adult: Secondary | ICD-10-CM

## 2018-03-14 DIAGNOSIS — G4700 Insomnia, unspecified: Secondary | ICD-10-CM | POA: Insufficient documentation

## 2018-03-14 DIAGNOSIS — I10 Essential (primary) hypertension: Secondary | ICD-10-CM | POA: Insufficient documentation

## 2018-03-14 LAB — POCT URINALYSIS DIP (CLINITEK)
Bilirubin, UA: NEGATIVE
Blood, UA: NEGATIVE
Glucose, UA: NEGATIVE mg/dL
Ketones, POC UA: NEGATIVE mg/dL
LEUKOCYTES UA: NEGATIVE
Nitrite, UA: NEGATIVE
PH UA: 7 (ref 5.0–8.0)
PROTEIN: NEGATIVE
SPEC GRAV UA: 1.01 (ref 1.010–1.025)
UROBILINOGEN UA: 0.2 U/dL

## 2018-03-14 MED ORDER — DICLOFENAC SODIUM 1 % TD GEL: 2.0000 g | Freq: Four times a day (QID) | TRANSDERMAL | 1 refills | Status: AC

## 2018-03-14 MED ORDER — CYCLOBENZAPRINE HCL 5 MG PO TABS: 5.0000 mg | ORAL_TABLET | Freq: Every day | ORAL | 0 refills | Status: AC | PRN

## 2018-03-14 MED ORDER — TETANUS-DIPHTH-ACELL PERTUSSIS 5-2.5-18.5 LF-MCG/0.5 IM SUSP: 0.5000 mL | Freq: Once | INTRAMUSCULAR | 0 refills | Status: AC

## 2018-03-14 MED ORDER — AMLODIPINE BESYLATE 5 MG PO TABS: 5.0000 mg | ORAL_TABLET | Freq: Every day | ORAL | 5 refills | Status: DC

## 2018-03-14 MED FILL — AMLODIPINE BESYLATE 5 MG TA: 5 | 30 days supply | Qty: 30 | Fill #0

## 2018-03-14 NOTE — Progress Notes (Signed)
Patient ID: Alexis Ramsey, female    DOB: 05-30-1985  MRN: [id number]  CC: New Patient (Initial Visit)   Subjective: Alexis Ramsey is a 32 y.o. female who presents for new pt visit. Her concerns today include:  Pt with hx of HTN, insomnia and migraine  Previous physician was at [facility].  Last seen yrs ago.  Was seeing Dr. Welton Flakes in [place].  Last seen 1 yr ago.    HTN:  Hx of HTN.  Was on BP med for about 6 mths.  Taken off med about 2 yrs ago because BP normalized "after I stop stressing."  Has deviced.  Last 2 readings were 124/94 yestreday, 129/94 -does not limit salt in foods.   -use to work out every day but had not done so in 1 yr since father died. Started working out daily for the past 3 wks.  Does 1 hr of cardio and 1 hr of some type of core training.   Eat only once a day which is lunch.  "I don't really do carbs or starches."  Drinks mainly water.  Reports that she had gained a lot of weight after she stopped working out for a year.  She is trying to get her weight back down  Dep/Anxiety screen positive:  Very depress aboutt father's death.  Better since she started going to the gym again 3 weeks ago.  She denies any suicidal ideation.  She is not interested in being placed on any medication.  She does not feel that she needs any counseling.    C/o pain below LT shoulder blade x 4 wks.  Some days constant, some days intermittent.  "Sharp pain like someone sticking."  Partially relieve with Ibuprofen 800 mg.  Took a muscle relaxant from her mom last night which helped a little  Has a knot in Rt breast x 2 yrs.  Seen in ER for it when it was discovered 2 years ago and reports being told that it is a cyst. No increase in size, not painful.   No fhx of breast CA  Family history, social history and surgical history reviewed. Patient Active Problem List   Diagnosis Date Noted  . Persistent disorder of initiating or maintaining sleep 08/20/2013     No current outpatient  medications on file prior to visit.   No current facility-administered medications on file prior to visit.     No Known Allergies  Social History   Socioeconomic History  . Marital status: Single    Spouse name: Not on file  . Number of children: Not on file  . Years of education: Not on file  . Highest education level: Not on file  Occupational History  . Occupation: Dentist: [employer]  . Occupation: CNA  Social Needs  . Financial resource strain: Not on file  . Food insecurity:    Worry: Not on file    Inability: Not on file  . Transportation needs:    Medical: Not on file    Non-medical: Not on file  Tobacco Use  . Smoking status: Never Smoker  . Smokeless tobacco: Never Used  Substance and Sexual Activity  . Alcohol use: No  . Drug use: No  . Sexual activity: Yes    Birth control/protection: None  Lifestyle  . Physical activity:    Days per week: Not on file    Minutes per session: Not on file  . Stress: Not  on file  Relationships  . Social connections:    Talks on phone: Not on file    Gets together: Not on file    Attends religious service: Not on file    Active member of club or organization: Not on file    Attends meetings of clubs or organizations: Not on file    Relationship status: Not on file  . Intimate partner violence:    Fear of current or ex partner: Not on file    Emotionally abused: Not on file    Physically abused: Not on file    Forced sexual activity: Not on file  Other Topics Concern  . Not on file  Social History Narrative  . Not on file    Family History  Problem Relation Age of Onset  . Diabetes Mellitus I Mother   . Headache Mother   . Hypertension Mother   . Diabetes Mother   . Diabetes Mellitus I Maternal Grandmother   . Diabetes Maternal Grandmother   . Hypertension Maternal Grandmother   . Hypertension Father   . Hypertension Other        grandparents on both sides  . Diabetes Paternal  Grandmother   . Hypertension Paternal Grandmother     Past Surgical History:  Procedure Laterality Date  . NO PAST SURGERIES      ROS: Review of Systems Abdomen: Reports pain across the lower abdomen x2 days.  She has been doing a lot of stomach crunches with her daily workouts.  She denies any dysuria or abnormal vaginal discharge. Lungs: She requests PPD skin test for work.  She works part-time as a Lawyer. PHYSICAL EXAM: BP (!) 156/102 (BP Location: Left Arm, Cuff Size: Large) Comment: recheck  Pulse 82   Temp 98.5 F (36.9 C) (Oral)   Resp 16   Ht 5\' 7"  (1.702 m)   Wt 234 lb (106.1 kg)   SpO2 100%   BMI 36.65 kg/m   Wt Readings from Last 3 Encounters:  03/14/18 234 lb (106.1 kg)  05/08/15 190 lb (86.2 kg)  08/20/13 228 lb 3.2 oz (103.5 kg)   BP 150/105 LT, 145/105 RT  Physical Exam  General appearance - alert, well appearing, young African-American female and in no distress Mental status - normal mood, behavior, speech, dress, motor activity, and thought processes Eyes - pupils equal and reactive, extraocular eye movements intact Mouth - mucous membranes moist, pharynx normal without lesions Neck - supple, no significant adenopathy Chest - clear to auscultation, no wheezes, rales or rhonchi, symmetric air entry Heart - normal rate, regular rhythm, normal S1, S2, no murmurs, rubs, clicks or gallops Breasts: No axillary lymphadenopathy.  She has a ring through the right nipple.  She has a 1 cm superficial subcutaneous cyst in the right breast medially and close to the breastbone.  No other breast masses appreciated Abdomen - soft, nontender, nondistended, no masses or organomegaly Musculoskeletal -no tenderness on palpation of the thoracic spine.  Mild tenderness in the paraspinal muscle below the left scapula.  No flank tenderness. Extremities -no lower extremity edema.  Depression screen PHQ 2/9 03/14/2018  Decreased Interest 0  Down, Depressed, Hopeless 3  PHQ - 2  Score 3  Altered sleeping 3  Tired, decreased energy 2  Change in appetite 1  Feeling bad or failure about yourself  3  Trouble concentrating 3  Moving slowly or fidgety/restless 0  Suicidal thoughts 0  PHQ-9 Score 15   GAD 7 : Generalized Anxiety Score  03/14/2018  Nervous, Anxious, on Edge 1  Control/stop worrying 3  Worry too much - different things 3  Trouble relaxing 3  Restless 3  Easily annoyed or irritable 3  Afraid - awful might happen 1  Total GAD 7 Score 17      ASSESSMENT AND PLAN: 1. Essential hypertension Given elevation in both systolic and diastolic blood pressures, previous history of hypertension and weight gain over the past year, I recommended restarting blood pressure medication.  DASH diet discussed.  Start amlodipine. - amLODipine (NORVASC) 5 MG tablet; Take 1 tablet (5 mg total) by mouth daily.  Dispense: 30 tablet; Refill: 5 - Basic metabolic panel - CBC - Comprehensive metabolic panel  2. Encounter for PPD skin test reading - TB Skin Test  3. Need for immunization against influenza - Flu Vaccine QUAD 36+ mos IM  4. Need for Tdap vaccination  5. Depression, unspecified depression type Patient admits depression after the death of her father 1 year ago.  However she feels that she is doing better over the past 3 weeks since she has been exercising.  She declines speaking with our LCSW  6. Acute left-sided thoracic back pain I think this is myofascial pain.  We will give some Voltaren gel and Flexeril to use as needed. - diclofenac sodium (VOLTAREN) 1 % GEL; Apply 2 g topically 4 (four) times daily.  Dispense: 100 g; Refill: 1 - cyclobenzaprine (FLEXERIL) 5 MG tablet; Take 1 tablet (5 mg total) by mouth daily as needed for muscle spasms.  Dispense: 30 tablet; Refill: 0  7. Cyst of right breast Observe since this has not changed in size over the past 2 years and seems more consistent with subcutaneous cyst  8. Lower abdominal pain Likely muscle  strain from abdominal crunches.  Advised her to discontinue doing crunches for the next 2 weeks - POCT URINALYSIS DIP (CLINITEK)  9. Obesity (BMI 35.0-39.9 without comorbidity) Discussed healthy eating habits.  She is already exercising regularly.   Patient was given the opportunity to ask questions.  Patient verbalized understanding of the plan and was able to repeat key elements of the plan.   Orders Placed This Encounter  Procedures  . Flu Vaccine QUAD 36+ mos IM  . Basic metabolic panel  . CBC  . Comprehensive metabolic panel  . TB Skin Test  . POCT URINALYSIS DIP (CLINITEK)     Requested Prescriptions   Signed Prescriptions Disp Refills  . Tdap (BOOSTRIX) 5-2.5-18.5 LF-MCG/0.5 injection 0.5 mL 0    Sig: Inject 0.5 mLs into the muscle once for 1 dose.  . diclofenac sodium (VOLTAREN) 1 % GEL 100 g 1    Sig: Apply 2 g topically 4 (four) times daily.  . cyclobenzaprine (FLEXERIL) 5 MG tablet 30 tablet 0    Sig: Take 1 tablet (5 mg total) by mouth daily as needed for muscle spasms.  Marland Kitchen amLODipine (NORVASC) 5 MG tablet 30 tablet 5    Sig: Take 1 tablet (5 mg total) by mouth daily.    Return in about 1 month (around 04/13/2018) for PAP.  Jonah Blue, MD, FACP

## 2018-03-14 NOTE — Progress Notes (Signed)
Pt states she has been having back pain for 4 weeks

## 2018-03-14 NOTE — Patient Instructions (Addendum)
Please schedule a nurse visit for Monday morning for ppd read   Star Amlodipine 5 mg daily for blood pressure.  Check blood pressure twice a week.  Goal is 130/80 or lower.  Try to limit salt in the foods.    Follow a Healthy Eating Plan - You can do it! Limit sugary drinks.  Avoid sodas, sweet tea, sport or energy drinks, or fruit drinks.  Drink water, lo-fat milk, or diet drinks. Limit snack foods.   Cut back on candy, cake, cookies, chips, ice cream.  These are a special treat, only in small amounts. Eat plenty of vegetables.  Especially dark green, red, and orange vegetables. Aim for at least 3 servings a day. More is better! Include fruit in your daily diet.  Whole fruit is much healthier than fruit juice! Limit "white" bread, "white" pasta, "white" rice.   Choose "100% whole grain" products, brown or wild rice. Avoid fatty meats. Try "Meatless Monday" and choose eggs or beans one day a week.  When eating meat, choose lean meats like chicken, [place], and fish.  Grill, broil, or bake meats instead of frying, and eat poultry without the skin. Eat less salt.  Avoid frozen pizzas, frozen dinners and salty foods.  Use seasonings other than salt in cooking.  This can help blood pressure and keep you from swelling Beer, wine and liquor have calories.  If you can safely drink alcohol, limit to 1 drink per day for women, 2 drinks for men   Tuberculin purified protein derivative, PPD injection What is this medicine? TUBERCULIN PURIFIED PROTEIN DERIVATIVE, PPD is a test used to detect if you have a tuberculosis infection. It will not cause tuberculosis infection. This medicine may be used for other purposes; ask your health care provider or pharmacist if you have questions. COMMON BRAND NAME(S): Aplisol, Tubersol What should I tell my health care provider before I take this medicine? They need to know if you have any of these conditions: -diabetes -HIV or AIDS -immune system problems -infection  (especially a virus infection such as chickenpox, cold sores, or herpes) -kidney disease -malnutrition -an unusual or allergic reaction to tuberculin purified protein derivative, PPD, other medicines, foods, dyes, or preservatives -pregnant or trying to get pregnant -breast-feeding How should I use this medicine? This medicine is for injection in the skin. It is given by a health care professional in a hospital or clinic setting. Talk to your pediatrician regarding the use of this medicine in children. While this drug may be prescribed in children, precautions do apply. Overdosage: If you think you have taken too much of this medicine contact a poison control center or emergency room at once. NOTE: This medicine is only for you. Do not share this medicine with others. What if I miss a dose? It is important not to miss your dose. Call your doctor or health care professional if you are unable to keep an appointment. What may interact with this medicine? -adalimumab -anakinra -etanercept -infliximab -live virus vaccines -medicines to treat cancer -steroid medicines like prednisone or cortisone This list may not describe all possible interactions. Give your health care provider a list of all the medicines, herbs, non-prescription drugs, or dietary supplements you use. Also tell them if you smoke, drink alcohol, or use illegal drugs. Some items may interact with your medicine. What should I watch for while using this medicine? See your health care provider as directed. This medicine does not protect against tuberculosis. What side effects may I notice  from receiving this medicine? Side effects that you should report to your doctor or health care professional as soon as possible: -allergic reactions like skin rash, itching or hives, swelling of the face, lips, or tongue -breathing problems Side effects that usually do not require medical attention (report to your doctor or health care  professional if they continue or are bothersome): -bruising -pain, redness, or irritation at site where injected This list may not describe all possible side effects. Call your doctor for medical advice about side effects. You may report side effects to FDA at 1-800-FDA-1088. Where should I keep my medicine? This drug is given in a hospital or clinic and will not be stored at home. NOTE: This sheet is a summary. It may not cover all possible information. If you have questions about this medicine, talk to your doctor, pharmacist, or health care provider.  2018 Elsevier/Gold Standard (2015-05-26 11:42:34)  Influenza Virus Vaccine injection (Fluarix) What is this medicine? INFLUENZA VIRUS VACCINE (in floo EN zuh VAHY ruhs vak SEEN) helps to reduce the risk of getting influenza also known as the flu. This medicine may be used for other purposes; ask your health care provider or pharmacist if you have questions. COMMON BRAND NAME(S): Fluarix, Fluzone What should I tell my health care provider before I take this medicine? They need to know if you have any of these conditions: -bleeding disorder like hemophilia -fever or infection -Guillain-Barre syndrome or other neurological problems -immune system problems -infection with the human immunodeficiency virus (HIV) or AIDS -low blood platelet counts -multiple sclerosis -an unusual or allergic reaction to influenza virus vaccine, eggs, chicken proteins, latex, gentamicin, other medicines, foods, dyes or preservatives -pregnant or trying to get pregnant -breast-feeding How should I use this medicine? This vaccine is for injection into a muscle. It is given by a health care professional. A copy of Vaccine Information Statements will be given before each vaccination. Read this sheet carefully each time. The sheet may change frequently. Talk to your pediatrician regarding the use of this medicine in children. Special care may be needed. Overdosage:  If you think you have taken too much of this medicine contact a poison control center or emergency room at once. NOTE: This medicine is only for you. Do not share this medicine with others. What if I miss a dose? This does not apply. What may interact with this medicine? -chemotherapy or radiation therapy -medicines that lower your immune system like etanercept, anakinra, infliximab, and adalimumab -medicines that treat or prevent blood clots like warfarin -phenytoin -steroid medicines like prednisone or cortisone -theophylline -vaccines This list may not describe all possible interactions. Give your health care provider a list of all the medicines, herbs, non-prescription drugs, or dietary supplements you use. Also tell them if you smoke, drink alcohol, or use illegal drugs. Some items may interact with your medicine. What should I watch for while using this medicine? Report any side effects that do not go away within 3 days to your doctor or health care professional. Call your health care provider if any unusual symptoms occur within 6 weeks of receiving this vaccine. You may still catch the flu, but the illness is not usually as bad. You cannot get the flu from the vaccine. The vaccine will not protect against colds or other illnesses that may cause fever. The vaccine is needed every year. What side effects may I notice from receiving this medicine? Side effects that you should report to your doctor or health care professional  as soon as possible: -allergic reactions like skin rash, itching or hives, swelling of the face, lips, or tongue Side effects that usually do not require medical attention (report to your doctor or health care professional if they continue or are bothersome): -fever -headache -muscle aches and pains -pain, tenderness, redness, or swelling at site where injected -weak or tired This list may not describe all possible side effects. Call your doctor for medical advice  about side effects. You may report side effects to FDA at 1-800-FDA-1088. Where should I keep my medicine? This vaccine is only given in a clinic, pharmacy, doctor's office, or other health care setting and will not be stored at home. NOTE: This sheet is a summary. It may not cover all possible information. If you have questions about this medicine, talk to your doctor, pharmacist, or health care provider.  2018 Elsevier/Gold Standard (2007-11-19 09:30:40)  Td Vaccine (Tetanus and Diphtheria): What You Need to Know 1. Why get vaccinated? Tetanus  and diphtheria are very serious diseases. They are rare in the [place] today, but people who do become infected often have severe complications. Td vaccine is used to protect adolescents and adults from both of these diseases. Both tetanus and diphtheria are infections caused by bacteria. Diphtheria spreads from person to person through coughing or sneezing. Tetanus-causing bacteria enter the body through cuts, scratches, or wounds. TETANUS (lockjaw) causes painful muscle tightening and stiffness, usually all over the body.  It can lead to tightening of muscles in the head and neck so you can't open your mouth, swallow, or sometimes even breathe. Tetanus kills about 1 out of every 10 people who are infected even after receiving the best medical care.  DIPHTHERIA can cause a thick coating to form in the back of the throat.  It can lead to breathing problems, paralysis, heart failure, and death.  Before vaccines, as many as 200,000 cases of diphtheria and hundreds of cases of tetanus were reported in the [place] each year. Since vaccination began, reports of cases for both diseases have dropped by about 99%. 2. Td vaccine Td vaccine can protect adolescents and adults from tetanus and diphtheria. Td is usually given as a booster dose every 10 years but it can also be given earlier after a severe and dirty wound or burn. Another vaccine, called  Tdap, which protects against pertussis in addition to tetanus and diphtheria, is sometimes recommended instead of Td vaccine. Your doctor or the person giving you the vaccine can give you more information. Td may safely be given at the same time as other vaccines. 3. Some people should not get this vaccine  A person who has ever had a life-threatening allergic reaction after a previous dose of any tetanus or diphtheria containing vaccine, OR has a severe allergy to any part of this vaccine, should not get Td vaccine. Tell the person giving the vaccine about any severe allergies.  Talk to your doctor if you: ? had severe pain or swelling after any vaccine containing diphtheria or tetanus, ? ever had a condition called Guillain Barre Syndrome (GBS), ? aren't feeling well on the day the shot is scheduled. 4. What are the risks from Td vaccine? With any medicine, including vaccines, there is a chance of side effects. These are usually mild and go away on their own. Serious reactions are also possible but are rare. Most people who get Td vaccine do not have any problems with it. Mild problems following Td vaccine: (Did not  interfere with activities)  Pain where the shot was given (about 8 people in 10)  Redness or swelling where the shot was given (about 1 person in 4)  Mild fever (rare)  Headache (about 1 person in 4)  Tiredness (about 1 person in 4)  Moderate problems following Td vaccine: (Interfered with activities, but did not require medical attention)  Fever over 102F (rare)  Severe problems following Td vaccine: (Unable to perform usual activities; required medical attention)  Swelling, severe pain, bleeding and/or redness in the arm where the shot was given (rare).  Problems that could happen after any vaccine:  People sometimes faint after a medical procedure, including vaccination. Sitting or lying down for about 15 minutes can help prevent fainting, and injuries caused  by a fall. Tell your doctor if you feel dizzy, or have vision changes or ringing in the ears.  Some people get severe pain in the shoulder and have difficulty moving the arm where a shot was given. This happens very rarely.  Any medication can cause a severe allergic reaction. Such reactions from a vaccine are very rare, estimated at fewer than 1 in a million doses, and would happen within a few minutes to a few hours after the vaccination. As with any medicine, there is a very remote chance of a vaccine causing a serious injury or death. The safety of vaccines is always being monitored. For more information, visit: http://floyd.org/ 5. What if there is a serious reaction? What should I look for? Look for anything that concerns you, such as signs of a severe allergic reaction, very high fever, or unusual behavior. Signs of a severe allergic reaction can include hives, swelling of the face and throat, difficulty breathing, a fast heartbeat, dizziness, and weakness. These would usually start a few minutes to a few hours after the vaccination. What should I do?  If you think it is a severe allergic reaction or other emergency that can't wait, call 9-1-1 or get the person to the nearest hospital. Otherwise, call your doctor.  Afterward, the reaction should be reported to the Vaccine Adverse Event Reporting System (VAERS). Your doctor might file this report, or you can do it yourself through the VAERS web site at www.vaers.LAgents.no, or by calling 1-502-843-0968. ? VAERS does not give medical advice. 6. The National Vaccine Injury Compensation Program The [employer] Vaccine Injury Compensation Program (VICP) is a federal program that was created to compensate people who may have been injured by certain vaccines. Persons who believe they may have been injured by a vaccine can learn about the program and about filing a claim by calling 1-647-421-9554 or visiting the VICP website at  SpiritualWord.at. There is a time limit to file a claim for compensation. 7. How can I learn more?  Ask your doctor. He or she can give you the vaccine package insert or suggest other sources of information.  Call your local or state health department.  Contact the Centers for Disease Control and Prevention (CDC): ? Call 915-656-7159 (1-800-CDC-INFO) ? Visit CDC's website at PicCapture.uy CDC Td Vaccine VIS (08/16/15) This information is not intended to replace advice given to you by your health care provider. Make sure you discuss any questions you have with your health care provider. Document Released: 02/18/2006 Document Revised: 01/12/2016 Document Reviewed: 01/12/2016 Elsevier Interactive Patient Education  2017 [employer].

## 2018-03-15 LAB — COMPREHENSIVE METABOLIC PANEL
ALBUMIN: 4.4 g/dL (ref 3.5–5.5)
ALT: 15 IU/L (ref 0–32)
AST: 15 IU/L (ref 0–40)
Albumin/Globulin Ratio: 1.5 (ref 1.2–2.2)
Alkaline Phosphatase: 54 IU/L (ref 39–117)
BILIRUBIN TOTAL: 0.4 mg/dL (ref 0.0–1.2)
BUN / CREAT RATIO: 10 (ref 9–23)
BUN: 7 mg/dL (ref 6–20)
CHLORIDE: 102 mmol/L (ref 96–106)
CO2: 22 mmol/L (ref 20–29)
Calcium: 9.5 mg/dL (ref 8.7–10.2)
Creatinine, Ser: 0.72 mg/dL (ref 0.57–1.00)
GFR calc non Af Amer: 111 mL/min/{1.73_m2} (ref >59)
GFR, EST AFRICAN AMERICAN: 128 mL/min/{1.73_m2} (ref >59)
GLOBULIN, TOTAL: 2.9 g/dL (ref 1.5–4.5)
Glucose: 85 mg/dL (ref 65–99)
Potassium: 3.8 mmol/L (ref 3.5–5.2)
Sodium: 138 mmol/L (ref 134–144)
Total Protein: 7.3 g/dL (ref 6.0–8.5)

## 2018-03-15 LAB — CBC
Hematocrit: 38.2 % (ref 34.0–46.6)
Hemoglobin: 12.8 g/dL (ref 11.1–15.9)
MCH: 29.1 pg (ref 26.6–33.0)
MCHC: 33.5 g/dL (ref 31.5–35.7)
MCV: 87 fL (ref 79–97)
PLATELETS: 236 10*3/uL (ref 150–450)
RBC: 4.4 x10E6/uL (ref 3.77–5.28)
RDW: 13.1 % (ref 12.3–15.4)
WBC: 7.4 10*3/uL (ref 3.4–10.8)

## 2018-03-17 ENCOUNTER — Ambulatory Visit: Payer: Self-pay | Attending: Internal Medicine

## 2018-03-17 LAB — TB SKIN TEST: TB Skin Test: NEGATIVE

## 2018-03-21 ENCOUNTER — Telehealth: Payer: Self-pay

## 2018-03-21 NOTE — Telephone Encounter (Signed)
Contacted pt to go over lab results pt is aware and doesn't have any questions or concerns 

## 2018-03-25 ENCOUNTER — Ambulatory Visit: Payer: Self-pay

## 2018-04-11 ENCOUNTER — Telehealth: Payer: Self-pay | Admitting: Internal Medicine

## 2018-04-11 NOTE — Telephone Encounter (Signed)
Patient came in and asked about changing her BP medication. She is stating that the medication is not working and would like to be put back on the prednisone 50 mg , hydrochlorothiazide, and the phentermine 30mg . Please fu with the patient when you can and send these medications to [facility]

## 2018-04-15 ENCOUNTER — Ambulatory Visit: Payer: Self-pay | Attending: Internal Medicine | Admitting: Internal Medicine

## 2018-04-15 ENCOUNTER — Encounter: Payer: Self-pay | Admitting: Internal Medicine

## 2018-04-15 VITALS — BP 145/98 | HR 78 | Temp 98.7°F | Resp 16 | Wt 226.6 lb

## 2018-04-15 DIAGNOSIS — Z79899 Other long term (current) drug therapy: Secondary | ICD-10-CM | POA: Insufficient documentation

## 2018-04-15 DIAGNOSIS — I10 Essential (primary) hypertension: Secondary | ICD-10-CM | POA: Insufficient documentation

## 2018-04-15 DIAGNOSIS — Z6839 Body mass index (BMI) 39.0-39.9, adult: Secondary | ICD-10-CM | POA: Insufficient documentation

## 2018-04-15 DIAGNOSIS — F329 Major depressive disorder, single episode, unspecified: Secondary | ICD-10-CM | POA: Insufficient documentation

## 2018-04-15 DIAGNOSIS — G4700 Insomnia, unspecified: Secondary | ICD-10-CM | POA: Insufficient documentation

## 2018-04-15 DIAGNOSIS — E669 Obesity, unspecified: Secondary | ICD-10-CM | POA: Insufficient documentation

## 2018-04-15 DIAGNOSIS — M533 Sacrococcygeal disorders, not elsewhere classified: Secondary | ICD-10-CM | POA: Insufficient documentation

## 2018-04-15 DIAGNOSIS — G43909 Migraine, unspecified, not intractable, without status migrainosus: Secondary | ICD-10-CM | POA: Insufficient documentation

## 2018-04-15 DIAGNOSIS — F419 Anxiety disorder, unspecified: Secondary | ICD-10-CM | POA: Insufficient documentation

## 2018-04-15 DIAGNOSIS — Z6835 Body mass index (BMI) 35.0-35.9, adult: Secondary | ICD-10-CM

## 2018-04-15 DIAGNOSIS — Z8249 Family history of ischemic heart disease and other diseases of the circulatory system: Secondary | ICD-10-CM | POA: Insufficient documentation

## 2018-04-15 MED ORDER — IBUPROFEN 600 MG PO TABS: 600.0000 mg | ORAL_TABLET | Freq: Two times a day (BID) | ORAL | 1 refills | Status: AC | PRN

## 2018-04-15 MED ORDER — HYDROCHLOROTHIAZIDE 12.5 MG PO TABS: 12.5000 mg | ORAL_TABLET | Freq: Every day | ORAL | 2 refills | Status: AC

## 2018-04-15 NOTE — Progress Notes (Signed)
Pt states the current bp medicine is not working for her   Pt is requesting to be put back on hctz

## 2018-04-15 NOTE — Patient Instructions (Signed)
Please give appointment with clinical pharmacist in 2 weeks for repeat blood pressure check.  Please bring your blood pressure monitoring device with you when you come to see the clinical pharmacist. Stop amlodipine.  Start hydrochlorothiazide instead.  After you have been on the medication for about 1 week, please return to the lab to have potassium level checked.

## 2018-04-15 NOTE — Progress Notes (Signed)
Patient ID: Alexis Ramsey, female    DOB: 1986-01-29  MRN: [id number]  CC: Medication Management   Subjective: Alexis Ramsey is a 32 y.o. female who presents for UC visit Her concerns today include:  Pt with hx of HTN, insomnia, dep/anx and migraine  HYPERTENSION On last visit patient was started on amlodipine 5 mg daily.  She presents today requesting to be changed to HCTZ as she feels the amlodipine is not working.  She reports being on HCTZ in the past.  However she has not taken amlodipine in the past 1 week because blood pressure readings reportedly were good.  She checks blood pressure every morning.  She gives range of 120/84, 120/90  Request rxn for Prednisone to use as needed for intermittent pain in the tailbone.  She reports fracturing her tailbone at the age of 36.  Since then she gets intermittent pain that is worse in the winter months and in rainy weather.  She reports that her previous physician used to prescribe it for her.  Request rxn for Phentermine to increase energy.  Reports that it was prescribed by a physician here in [facility] for her in the past for this reason.  She still goes to the gym 6 days a week and works out for at least an hour that includes cardio and some weight training.  She states that the phentermine keeps her energy level up.  Reports that she continues to do well with her eating habits.  She reports having took nutrition classes several years ago. She has lost 8 pounds since last visit with me.  Patient Active Problem List   Diagnosis Date Noted  . Essential hypertension 03/14/2018  . Depression 03/14/2018  . Cyst of right breast 03/14/2018  . Obesity (BMI 35.0-39.9 without comorbidity) 03/14/2018  . 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 amLODipine (NORVASC) 5 MG tablet Take 1 tablet (5 mg total) by mouth daily. 30 tablet 5    . cyclobenzaprine (FLEXERIL) 5 MG tablet Take 1 tablet (5 mg total) by mouth daily as needed for muscle spasms. 30 tablet 0  . diclofenac sodium (VOLTAREN) 1 % GEL Apply 2 g topically 4 (four) times daily. 100 g 1   No current facility-administered medications on file prior to visit.     No Known Allergies  Social History   Socioeconomic History  . Marital status: Single    Spouse name: Not on file  . Number of children: Not on file  . Years of education: Not on file  . Highest education level: Not on file  Occupational History  . Occupation: Dentist: [employer]  . Occupation: CNA  Social Needs  . Financial resource strain: Not on file  . Food insecurity:    Worry: Not on file    Inability: Not on file  . Transportation needs:    Medical: Not on file    Non-medical: Not on file  Tobacco Use  . Smoking status: Never Smoker  . Smokeless tobacco: Never Used  Substance and Sexual Activity  . Alcohol use: No  . Drug use: No  . Sexual activity: Yes    Birth control/protection: None  Lifestyle  . Physical activity:    Days per week: Not on file    Minutes per session: Not on file  . Stress: Not  on file  Relationships  . Social connections:    Talks on phone: Not on file    Gets together: Not on file    Attends religious service: Not on file    Active member of club or organization: Not on file    Attends meetings of clubs or organizations: Not on file    Relationship status: Not on file  . Intimate partner violence:    Fear of current or ex partner: Not on file    Emotionally abused: Not on file    Physically abused: Not on file    Forced sexual activity: Not on file  Other Topics Concern  . Not on file  Social History Narrative  . Not on file    Family History  Problem Relation Age of Onset  . Diabetes Mellitus I Mother   . Headache Mother   . Hypertension Mother   . Diabetes Mother   . Diabetes Mellitus I Maternal Grandmother    . Diabetes Maternal Grandmother   . Hypertension Maternal Grandmother   . Hypertension Father   . Hypertension Other        grandparents on both sides  . Diabetes Paternal Grandmother   . Hypertension Paternal Grandmother     Past Surgical History:  Procedure Laterality Date  . NO PAST SURGERIES      ROS: Review of Systems Negative except as above. PHYSICAL EXAM: BP (!) 145/98 (BP Location: Left Arm, Cuff Size: Large) Comment: recheck  Pulse 78   Temp 98.7 F (37.1 C) (Oral)   Resp 16   Wt 226 lb 9.6 oz (102.8 kg)   SpO2 98%   BMI 35.49 kg/m   Wt Readings from Last 3 Encounters:  04/15/18 226 lb 9.6 oz (102.8 kg)  03/14/18 234 lb (106.1 kg)  05/08/15 190 lb (86.2 kg)    Physical Exam  General appearance - alert, well appearing, and in no distress Mental status - normal mood, behavior, speech, dress, motor activity, and thought processes Chest - clear to auscultation, no wheezes, rales or rhonchi, symmetric air entry Heart - normal rate, regular rhythm, normal S1, S2, no murmurs, rubs, clicks or gallops   ASSESSMENT AND PLAN:  1. Essential hypertension Reported home blood pressure readings not at goal with diastolic readings above 80.  Blood pressure today is also elevated.  She has been off amlodipine for 1 week.  Advised her to restart the amlodipine or we can change it to hydrochlorothiazide.  She prefers the latter. Advised to follow-up with clinical pharmacist in about 2 weeks and to bring home BP monitoring device with her so that it can be checked against ours for accuracy. - hydrochlorothiazide (HYDRODIURIL) 12.5 MG tablet; Take 1 tablet (12.5 mg total) by mouth daily.  Dispense: 30 tablet; Refill: 2 - Potassium; Future  2. Obesity (BMI 35.0-39.9 without comorbidity) Commended her on weight loss so far.  Encouraged her to continue regular exercise.  She declines referral to nutritionist.  I declined prescribing phentermine to boost energy level.  3. Tail  bone pain We can have her use ibuprofen as needed rather than use of prednisone.  Patient is agreeable to this.  Went over adverse effects of long-term use of prednisone.  Patient was given the opportunity to ask questions.  Patient verbalized understanding of the plan and was able to repeat key elements of the plan.   No orders of the defined types were placed in this encounter.    Requested Prescriptions  No prescriptions requested or ordered in this encounter    No follow-ups on file.  Karle Plumber, MD, FACP

## 2018-04-18 ENCOUNTER — Other Ambulatory Visit: Payer: Self-pay | Admitting: Internal Medicine

## 2018-08-07 ENCOUNTER — Telehealth: Payer: Self-pay | Admitting: *Deleted

## 2018-08-07 NOTE — Telephone Encounter (Signed)
Patient verified DOB Patient is aware of her mother being a confirmed Covid-19. Patient has been quarintined with the daughter who has been minimally self quarantining. MA asked daughter if she has been working with patients because she does work in Teacher, music. Patient hesitantly stated no. Patient was instructed to report to the Health Dept to be tested for the Covid-19 being that she has been living in the home with her mother. Patient reluctantly agreed to contacting and reporting to the health dept. Daughter states she didn't feel the need since no medication was being given and she has been wearing mask every where. MA stressed the importance of having the testing completed and being quarantined! Patients mothers PCP was on the other line so MA asked daughter to click over and speak with physician.

## 2018-08-13 ENCOUNTER — Telehealth: Payer: Self-pay | Admitting: *Deleted

## 2018-08-13 NOTE — Telephone Encounter (Signed)
MA spoke with the nurse Tammy involved at the health department with COVID-19. MA relayed the positive patient result and that fax of results was sent on 08/07/2018. MA informed Tammy of daughter being advised to report to the Health department for testing being that she lives in the home with the positive patient.  MA relayed to Tammy the daughters refusal and informed her of the daughter still traveling out in the community.  Patients daughter is a Engineer, civil (consulting) and she is still working in the community even though she has been advised to be checked and to quarantine until after the patient shows no sign of symptoms.  Tammy received all the information for the daughters location and contact.
